# Patient Record
Sex: Male | Born: 1955 | Hispanic: No | Marital: Married
Health system: Northeastern US, Academic
[De-identification: ages and names within clinical notes are randomized; demographics above are authoritative.]

---

## 2019-09-23 ENCOUNTER — Encounter: Admit: 2019-09-23 | Payer: PRIVATE HEALTH INSURANCE

## 2019-10-18 ENCOUNTER — Ambulatory Visit: Admit: 2019-10-18 | Payer: PRIVATE HEALTH INSURANCE | Attending: Urology

## 2019-10-18 ENCOUNTER — Encounter: Admit: 2019-10-18 | Payer: PRIVATE HEALTH INSURANCE | Attending: Urology

## 2019-10-18 DIAGNOSIS — H18833 Recurrent erosion of cornea, bilateral: Secondary | ICD-10-CM

## 2019-10-18 DIAGNOSIS — S0500XA Injury of conjunctiva and corneal abrasion without foreign body, unspecified eye, initial encounter: Secondary | ICD-10-CM

## 2019-10-18 DIAGNOSIS — H04123 Dry eye syndrome of bilateral lacrimal glands: Secondary | ICD-10-CM

## 2019-10-18 DIAGNOSIS — Z9889 Other specified postprocedural states: Secondary | ICD-10-CM

## 2019-10-18 DIAGNOSIS — N399 Disorder of urinary system, unspecified: Secondary | ICD-10-CM

## 2019-10-18 DIAGNOSIS — B182 Chronic viral hepatitis C: Secondary | ICD-10-CM

## 2019-10-18 DIAGNOSIS — N401 Enlarged prostate with lower urinary tract symptoms: Secondary | ICD-10-CM

## 2019-10-18 DIAGNOSIS — R748 Abnormal levels of other serum enzymes: Secondary | ICD-10-CM

## 2019-10-18 MED ORDER — FINASTERIDE 5 MG TABLET
5 mg | ORAL_TABLET | Freq: Every day | ORAL | 7 refills | Status: AC
Start: 2019-10-18 — End: ?

## 2019-10-18 NOTE — Progress Notes
Follow-up Note Brent Joseph is a 64 y.o. male is here for follow up with established diagnosis nocturia Pt last evaluated on 09/21/19 for initial urological consultation and referral by Brent Joseph is retiring and wants to get some sleepHad trial of flomax in 2018 with little benefit acturally felt it made things worseMain source of hydration is water 7 bottles per day.  hot tea 3 x per day and iced tea 2-3 bottles at night.  Has cut down on bladder Goes to bed at 7:30 pm and up to void between 1 am and 2 am  Followed by hourly voids.There are times unable to leave house  keeps urine cups in car in case he is unable to make itReports positive bathroom mapping.  [place] PSA 0.9Pt started trial of Uroxatral on 09/21/19.  Pt feels that uroxatral is working better than flomax.  Pt states he is able to sleep 5 hoursBefore he had to void.   3/17/21Renal [place]:  No stones no hydro.  Elevated post void residual volume to 130cc in the setting of prostamegaly with circumferential urinary bladder wall thickening.  Consider UA to exclude infection as potential cause of urinary bladder wall thickeningHPIRalph  reports that he has never smoked. He has never used smokeless tobacco. He reports that he does not drink alcohol or use drugs.  Past Medical History: Diagnosis Date ? 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 Corneal abrasion   recurrent ? Dry eyes  ? Elevated liver enzymes  ? Recurrent erosion of both corneas  ? S/P inguinal hernia repair  Past Surgical History: Procedure Laterality Date ? ABDOMINAL SURGERY   ? COLONOSCOPY   ? FRACTURE SURGERY   ? INGUINAL HERNIA REPAIR   ? SHOULDER SURGERY   ? SHOULDER SURGERY    for repet. dislocation ? WRIST FRACTURE SURGERY   No Known AllergiesReview Of SystemsReview of SystemsReview of SystemsGeneral: Patient denies fevers, chills, sweats, loss of appetite, fatigue, malaise. Eyes: Patient denies blurring, double vision, vision loss, eye pain, light sensitivity. Ears/Nose/Throat: Patient denies earache, ringing in ears, decreased hearing, nasal congestion, difficulty swallowing. Cardiovascular: Patient denies chest pains, palpitations, fainting spells, shortness of breath, ankle swelling.  Respiratory: Patient denies cough, wheezing.  Skin: Patient denies rash, itching, suspicious lesions. GI: Patient denies nausea, vomiting, diarrhea, constipation, change in bowel habits, abdominal pain, bloody or black stools.  GU: See HPI  Musculoskeletal: Patient denies back pain, joint pain, joint swelling, muscle cramps, muscle weakness, stiffness, arthritis. Neurological: Patient denies transient paralysis, weakness, numbness, tingling sensation, seizures, tremors, headaches.  Psychiatric: Patient denies depression, anxiety, memory loss, suicidal thoughts, hallucinations, paranoia. Endocrine: Patient denies cold intolerance, heat intolerance, increased thirst, increased appetite, large quantities of urine. Heme/Lymphatic: Patient denies abnormal bruising, bleeding, enlarged lymph nodes. Allergy/Immunologic: Patient denies persistent infections, HIV exposures. Physical ExamThere were no vitals taken for this visit.No results found for: UCOLOR, UGLUCOSE, UKETONE, USPECGRAVITY, UPH, UPROTEIN, UNITRATES, UBLOOD, ULEUKOCYTES   No results found for: PVRPOCTPhysical ExamPhysical Exam Constitutional: oriented to person, place, and time.  appears well-developed and well-nourished. HENT: Head: Normocephalic. Eyes: No scleral icterus. Cardiovascular: Normal rate.  Pulmonary/Chest: Effort normal. Abdominal: Soft. GN:FAOZHYQ contents normal, DRE: smooth symmetrical prostate, no nodules appreciatedMusculoskeletal: Normal range of motion. Lymphadenopathy:    no cervical adenopathy. Neurological: alert and oriented to person, place, and time. Skin: Skin is warm and dry. Psychiatric: Has a normal mood and affect.  His behavior is normal. Judgment and thought content normal. Assessment & Plan:Brent Joseph is a 64 y.o. male Encounter Diagnoses Name SNOMED Brent Joseph(R) Primary? ? Benign prostatic hyperplasia with nocturia NOCTURIA DUE TO BENIGN PROSTATIC HYPERTROPHY Yes Stop all liquids 2 hours prior to bedAvoid bladder irritantsContinue uroxatralAdd proscarCysto to further evaluate bladder wall thickening noted on Brent Ravel, APRN

## 2019-10-19 DIAGNOSIS — R351 Nocturia: Secondary | ICD-10-CM

## 2019-11-23 ENCOUNTER — Ambulatory Visit: Admit: 2019-11-23 | Payer: PRIVATE HEALTH INSURANCE | Attending: Urology

## 2019-11-23 ENCOUNTER — Encounter: Admit: 2019-11-23 | Payer: PRIVATE HEALTH INSURANCE | Attending: Urology

## 2019-11-23 DIAGNOSIS — H04123 Dry eye syndrome of bilateral lacrimal glands: Secondary | ICD-10-CM

## 2019-11-23 DIAGNOSIS — N400 Enlarged prostate without lower urinary tract symptoms: Secondary | ICD-10-CM

## 2019-11-23 DIAGNOSIS — S0500XA Injury of conjunctiva and corneal abrasion without foreign body, unspecified eye, initial encounter: Secondary | ICD-10-CM

## 2019-11-23 DIAGNOSIS — B182 Chronic viral hepatitis C: Secondary | ICD-10-CM

## 2019-11-23 DIAGNOSIS — H18833 Recurrent erosion of cornea, bilateral: Secondary | ICD-10-CM

## 2019-11-23 DIAGNOSIS — R748 Abnormal levels of other serum enzymes: Secondary | ICD-10-CM

## 2019-11-23 DIAGNOSIS — Z9889 Other specified postprocedural states: Secondary | ICD-10-CM

## 2019-11-23 MED ORDER — LIDOCAINE 2 % MUCOSAL JELLY IN APPLICATOR
2 % | Freq: Once | URETHRAL | Status: CP
Start: 2019-11-23 — End: ?
  Administered 2019-11-23: 14:00:00 2 mL via URETHRAL

## 2019-11-23 NOTE — Progress Notes
Time Out Documentation?	Procedure Name: cystoscopy?	Procedure Time: 10:21 AM ?	Team Members Present: Marena Chancy, RN, Pamella Pert, MD?	Time Out initiated after patient positioned & prior to beginning of procedure: Yes?	Name of Patient  & MRUN # or DOB stated and matches ID band or previously confirmed med record number: Yes?	Proceduralist states or confirms procedure to be performed: Yes?	Procedural consent is used to verify procedure performed  & matches pt identifiers:Yes?	Site of procedure(s) (with laterality or level) is topically marked per policy & visible after draping:N/A ?	Final check completed on all specimens: not applicable?	Serial number on device: LOT [id number]

## 2019-11-23 NOTE — Progress Notes
Brent Joseph is a 64 y.o. male who presents with a chief complaint of BPHThe patient has BPH with significant lower urinary tract symptoms.  He had complained of significant nocturia (4-10x) as well as urinary urgency and frequency.  He did not have benefit from tamsulosin which he tried in 2018. Most recently he has been on alfuzosin 10 milligrams nightly since March 2021, noted some improvement in urinary symptoms. Nocturia down to 2x.Started on finasteride 5 milligrams in April 2021.He is married, rarely sexually active.Renal ultrasound on 09/22/2019 described enlarged prostate (35 cc) with circumferential urinary bladder wall thickening, PVR 130 cubic centimeters.Presents for further discussion of management of urinary symptoms.3/17/21STUDY: US RENAL?INDICATION: urinary frequency?COMPARISON: 11/28/2015?TECHNIQUE:  Grey scale and color Doppler images were obtained for interpretation.?FINDINGS:  The right kidney is 11.8 cm in length and demonstrates normal echotexture. There is no focal renal mass, nephrolithiasis or hydronephrosis. There is a mid to lower pole cyst measuring 5.9 x 5.7 x 5.8 cm, previously measuring 4.9 x 5.1 x 4.5 cm.?The left kidney is 11.8 cm in length and demonstrates normal echotexture. There is no focal renal mass, nephrolithiasis, or hydronephrosis. ?The urinary bladder is moderately distended. No focal lesion is identified. There is circumferential urinary bladder wall thickening, more so posteriorly. Bilateral ureteral jets are seen. There is prostatomegaly with the prostate volume measuring 36 cc. Echogenic foci within the prostate gland likely reflect calcifications. The prevoid volume is 320 cc. The post void residual volume is 130 cc.?   IMPRESSION: No hydronephrosis or nephrolithiasis. ?Elevated post void residual volume (130 cc) in the setting of prostatomegaly with circumferential urinary bladder wall thickening. Consider urinary analysis to exclude infection as potential cause of the urinary bladder wall thickening.AUA SYMPTOM SCORE BPHINCOMPLETE EMPTYING: Over the last month, how often have you had a sensation of not emptying your bladder completely after you finished urinating? : More than half the timeFREQUENCY: Over the last month, how often have you had to urinate again less than 2 hours after you finished urinating? : Almost alwaysINTERMITTENCY: Over the last month, how often have you stopped and started again several times when you urinated?: More than half the timeURGENCY: During the last month, how often have you found it difficult to postpone urination? : Almost alwaysWEAK STREAM: During the last month, how often have you had a weak urinary stream? : Less than half the timeSTRAINING: During the last month, how often have you had to push or strain to begin urination? : Not at allNOCTURIA: During the last month, how many times did you most typically get up to urinate from the time you went to bed at night until the time you got up in the morning? : 2 times a nightTotal Score: 22If you were to spend the rest of your life with your urinary condition just the way it is now, how would you feel about that? : MixedTotal Score (with QOL question): 25Recent PSA Results:PSA RESULTS Latest Ref Rng & Units 09/09/2019 04/27/2016 01/31/2012 01/31/2012 PSA < OR = 4.0 ng/mL 0.9 0.8 0.8 0.8 Past Medical HistoryPast Medical History: Diagnosis Date ? 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 Corneal abrasion   recurrent ? Dry eyes  ? Elevated liver enzymes  ? Recurrent erosion of both corneas  ? S/P inguinal hernia repair  Past Surgical HistoryPast Surgical History: Procedure Laterality Date ? ABDOMINAL SURGERY   ? COLONOSCOPY   ? FRACTURE SURGERY   ?  INGUINAL HERNIA REPAIR   ? SHOULDER SURGERY   ? SHOULDER SURGERY    for repet. dislocation ? WRIST FRACTURE SURGERY   AllergiesNo Known AllergiesMedicationsOutpatient Encounter Medications as of 11/23/2019 Medication Sig Dispense Refill ? alfuzosin (UROXATRAL) 10 mg 24 hr extended release tablet Take 1 tablet (10 mg total) by mouth daily. 30 tablet 11 ? finasteride (PROSCAR) 5 mg tablet Take 1 tablet (5 mg total) by mouth daily. 30 tablet 6 ? LANOLIN/MINERAL OIL/PETROLATUM (ARTIFICIAL TEARS OPHT) Place 1 drop into both eyes 5 (five) times daily..    ? glecaprevir-pibrentasvir (MAVYRET) 100-40 mg per tablet Take 3 tablets by mouth daily..   Facility-Administered Encounter Medications as of 11/23/2019 Medication Dose Route Frequency Provider Last Rate Last Admin ? [COMPLETED] lidocaine uro-jet (XYLOCAINE) 2 % jelly 11 mL  11 mL INTRA-URETHRAL Once Pamella Pert, MD   11 mL at 11/23/19 1017  Social HistorySocial History Tobacco Use ? Smoking status: Never Smoker ? Smokeless tobacco: Never Used Substance Use Topics ? Alcohol use: No ? Drug use: No   Comment: never used drugs; had 7 tattoos since 1976; no accidental needlestick exposures; no risky sexual contacts; no body piercings  Family History Family History Problem Relation Age of Onset ? Liver disease Sister       HCV ? Congenital heart disease Sister  ? Stroke Father 78 ? Coronary Artery Disease Father 73 ? Breast cancer Father 26 ? No Known Problems Mother  ? No Known Problems Brother  ? No Known Problems Brother  ? No Known Problems Son  ? Liver cancer Neg Hx  Review of SystemsConstitutional: negative for weight changeCardiovascular: negative for chest painRespiratory: negative for cough or SOBAll other systems reviewed and are negative Physical ExamBP (!) 149/87 (Site: l a, Position: Sitting, Cuff Size: Large)  - Pulse 78  - Temp 97.7 ?F (36.5 ?C)  - Ht 5' 10 (1.778 m)  - Wt 111.1 kg  - SpO2 96%  - BMI 35.15 kg/m? Constitutional:  Well developed, no acute distress.  EENT: Sclera normal, normal nares and mucosaRespiratory:  Normal respiratory effortCardiovascular: RRR,  No evidence of extremity swelling.Abdomen: No masses are palpated.  No tenderness. Inguinal: No abnormalities in either inguinal area.  Skin: warm and dryMusculoskeletal: Gait appears normal.  Neurological: well oriented to time, place, and person.   Kidney: No CVA tenderness Bladder: The bladder appears to be empty.  No masses are palpated.  There is no tenderness.  Lymphatics: There are no abnormal nodes palpated in either inguinal area.Penis: normal and is circumcised. No external lesions or masses are present. Urethral Meatus: The meatus is normal in size and location.Scrotum:  No erythema or edema of scrotal skin.   Testicles: Right testicle normal to palpation. Left testicle normal to palpation.Epididymides: Right epididymis and vas deferens are normal to palpation. Left epididymis and vas deferens normal to palpation.  No masses or tenderness noted.   Anus/Perineum: No anal or perineal masses noted.   Sphincter: The sphincter has good tone.   No rectal mass is noted.   No hemorrhoids are notedProstate:  Approximately 30 cubic centimeters, normal consistency, nontender, symmetric  Lab Results Component Value Date  UCOLOR yellow 11/23/2019  UGLUCOSE Negative 11/23/2019  UKETONE Negative 11/23/2019  USPECGRAVITY 1.015 11/23/2019  UPROTEIN Negative 11/23/2019  UNITRATES Negative 11/23/2019  UBLOOD Trace 11/23/2019  ULEUKOCYTES Negative 11/23/2019    Post Void Bladder Scan Measurement Date Value Ref Range Status 11/23/2019 2 mL Final PROCEDURES IN-OFFICE: Cystoscopy:??SURGEON: Alphia Kava MD ??ANESTHESIA:Under sterile conditions, Xylocaine  jelly was inserted into the urethra for local anesthesia. ??FINDINGS AND PROCEDURE:The history, physical findings, current medications, and indications were reviewed prior to the procedure. I discussed the procedure with the patient, including possible complications. Informed consent was obtained and a time-out performed??Patient was prepped and draped in the usual fashion in the supine position. The cystoscopy was performed using the flexible cystoscope and sterile water. ?Urethra: No abnormalities of the urethra are noted. Prostate:  Elevated median bar creating small bladder neck, mild obstruction of lateral lobes  Bladder: Bladder shows mild trabeculations. No foreign bodies. No evidence of tumor or stones.  Increased bladder expansion. Trigone unremarkable. Ureteral orifices in the normal orthotopic positionAssessment:Meliton Testa is a 64 y.o. male with BPH and associated lower urinary tract symptoms, incomplete bladder emptyingPlan:DiscussionBPH:I have discussed BPH with patient, along with the various treatments.  Discussed role of watchful waiting and lifestyle modifications (decrease caffeine, PM fluids, alcohol) to help with the symptoms of BPH. Discussed medical management with alpha blockers and/or 5-alpha reductase inhibitors including side effects of these medications. Also discussed surgical management of BPH with TURP/TUIP, UroLift, laser treatment (ablation, HOLEP), prostate artery embolization,  and open surgery. I explained the prognosis and possible complications of each surgery. The patient expresses an understanding of the treatment, possible reactions, and possible prognosis.Cystoscopy revealed obstructive bladder neck due to elevated median bar prostateContinue alfuzosin 10 milligrams nightlyStop finasteridePatient desires prostate reductive surgeryWill schedule a HOLEP, will attempt selective enucleation of the middle lobe to try to preserve antegrade ejaculationHOLEP (Holmium laser enucleation of the prostate)I explained a HOLEP is a surgery performed with special equipment through the urethra to remove  obstructing prostate tissue that is causing urinary symptoms.  Usually a Foley catheter will be placed and continuous bladder irrigation is initiated following surgery, and the patient will be monitored in the hospital for 1 to 2 days. It is common to have irritative voiding symptoms such as urinary urgency/frequency/nocturia for several weeks to months following a HOLEP; it is also common to see blood in the urine during this time period. There is a risk of retrograde ejaculation with a HOLEP. Additional risks are a urethral stricture and bladder neck contracture which would possibly require further treatment. The risk of erectile dysfunction is very low following a HOLEP. Some patients will describe post void dribbling and is it fairly common to have transient stress urinary incontinence which usually resolves over several months.  The patient understands the risk and benefits of a HOLEP and would like to proceed with surgery.Orders Placed This Encounter Procedures ? POC urinalysis manual w/o scope ? POCT Post Void Residual No follow-ups on file.Patient will let us know if any new problems or symptoms arise in the interim.Pamella Pert, MDYale [phone]

## 2019-11-23 NOTE — Progress Notes
AUA SYMPTOM SCORE BPHINCOMPLETE EMPTYING: Over the last month, how often have you had a sensation of not emptying your bladder completely after you finished urinating? : More than half the timeFREQUENCY: Over the last month, how often have you had to urinate again less than 2 hours after you finished urinating? : Almost alwaysINTERMITTENCY: Over the last month, how often have you stopped and started again several times when you urinated?: More than half the timeURGENCY: During the last month, how often have you found it difficult to postpone urination? : Almost alwaysWEAK STREAM: During the last month, how often have you had a weak urinary stream? : Less than half the timeSTRAINING: During the last month, how often have you had to push or strain to begin urination? : Not at allNOCTURIA: During the last month, how many times did you most typically get up to urinate from the time you went to bed at night until the time you got up in the morning? : 2 times a nightTotal Score: 22If you were to spend the rest of your life with your urinary condition just the way it is now, how would you feel about that? : MixedTotal Score (with QOL question): 25

## 2019-11-23 NOTE — Progress Notes
Brent Joseph is here today for a cystoscopy.  Patient informed of procedure and questions answered.  Instructions for pre procedure given to patient.  Written consent and time out obtained.  Patient prepped according to protocol.  Tolerated procedure well.  VSS.  Post procedure instructions reviewed with patient.Pre-op HOLEP instructions reviewed in detail. Kegel exercises reviewed. Verbal and written information provided. UC sent today in clinic. Pre-op blood work orders placed to [facility] per pt request. He is aware he will receive a call within 5 days from surgical scheduler.

## 2019-11-23 NOTE — Patient Instructions
Cystoscopy, Care AfterRefer to this sheet in the next few weeks. These instructions provide you with information about caring for yourself after your procedure. Your health care provider may also give you more specific instructions. Your treatment has been planned according to current medical practices, but problems sometimes occur. Call your health care provider if you have any problems or questions after your procedure.What can I expect after the procedure?After the procedure, it is common to have:?	Mild pain when you urinate. Pain should stop within a few minutes after you urinate. This may last for up to 1 week.?	A small amount of blood in your urine for several days.?	Feeling like you need to urinate but producing only a small amount of urine.Follow these instructions at home:Medicines?	Take over-the-counter and prescription medicines only as told by your health care provider.?	If you were prescribed an antibiotic medicine, take it as told by your health care provider. Do not stop taking the antibiotic even if you start to feel better.General instructions?	Return to your normal activities as told by your health care provider. Ask your health care provider what activities are safe for you.?	Do not drive for 24 hours if you received a sedative.?	Watch for any blood in your urine. If the amount of blood in your urine increases, call your health care provider.?	Follow instructions from your health care provider about eating or drinking restrictions.?	If a tissue sample was removed for testing (biopsy) during your procedure, it is your responsibility to get your test results. Ask your health care provider or the department performing the test when your results will be ready.?	Drink enough fluid to keep your urine clear or pale yellow.?	Keep all follow-up visits as told by your health care provider. This is important.Contact a health care provider if:?	You have pain that gets worse or does not get better with medicine, especially pain when you urinate.?	You have difficulty urinating.Get help right away if:?	You have more blood in your urine.?	You have blood clots in your urine.?	You have abdominal pain.?	You have a fever or chills.?	You are unable to urinate.This information is not intended to replace advice given to you by your health care provider. Make sure you discuss any questions you have with your health care provider.Document Released: 01/11/2005 Document Revised: 11/30/2015 Document Reviewed: 11/03/2016lsevier Interactive Patient Education ? 2019 Elsevier Inc.You have been referred by your provider for HoLEP procedure for BPH. Below is an overview of the procedure and what to expect before, during and after surgery. Please keep in mind not all patients follow the same course based on specific anatomy and medical and surgical history. Specific questions should be addressed with your provider and nurse. HoLEPThe Holmium Laser Enucleation of the Prostate (HoLEP) is a laser surgery used to treat benign prostatic hyperplasia. With BPH, the prostate becomes enlarged and can cause obstruction of urine flow. This can lead to many symptoms such as urinary frequency, excessive urination overnight, difficulty starting urination, or even inability to urinate. With the HoLEP, a laser is used to cut and remove the prostate tissue that is causing this blockage. After healing from this surgery, most patients experience a decrease in urinary symptoms and the ability to urinate with ease.Before surgery:-You will need a pre-operative consultation which will include a discussion of medical history along with a physical examination. BPH symptoms will be assessed. A bladder scan will be measured to see how much urine is left in the bladder after urination.-You may need a cystoscopy, which is done in the office. This will allow the urethra, prostate, and bladder to be visualized to determine if  HoLEP is the best option.-If you decide to proceed with the HoLEP, one of our Surgical Schedulers will schedule the surgery and communicate with you about any blood work or tests that will be needed prior to surgery. -The use of any blood thinners must be discontinued before surgery. You will need to contact your PCP or cardiologist that prescribes the medication to let them know you are scheduled for this procedure and be sure they approve of stopping the medication. If you are UNABLE to stop blood thinners you MUST contact our office to discuss with the provider/nurse. Aspirin and NSAID usage needs to be discontinued 10 days before surgery. Again, if you are UNABLE to stop these medications you MUST contact our office. -The day before surgery, the patient is contacted between 3pm-5pm by an automated phone call regarding the time to report to the hospital and the pre-operative instructions related to Operating Room and Hospital policies. During surgery:-This surgery is typically done under general anesthesia and can take anywhere from 1-4 hours, depending on the size of the prostate.-All instruments for this surgery are inserted through the urethra, which means there are no incisions on the body.-A special scope is inserted in the penis, then a laser is inserted and used to enucleate (core out) the obstructive prostate tissue from it's capsule (outer shell), often compared to peeling an orange from the inside. The excised prostate tissue is then removed. At the end of the surgery, a catheter is placed. -The prostate tissue is then sent to the Pathology lab to check for any signs of cancer.After Surgery: - Patients are typically sent home the same day as surgery, although some patients may require a 1 night stay in the hospital.-Patients are usually discharged with a urinary catheter in place, which will be removed in the office 2 days after surgery. This will be scheduled as a Nurse Visit for a voiding trial.-For some patients, the catheter is removed before discharge from the hospital. You will then follow up with our office 2 days after surgery to have a bladder scan completed.-Blood in the urine is expected after this surgery, often at the beginning and end of your urinary stream.-Once the catheter is removed, it is common to experience urinary leakage, especially with activity or position changes. Leakage usually improves within 6-12 weeks. The chance of long term leakage after this surgery is very low. It is common to experience urinary frequency and urgency for several weeks after this surgery.-Kegel exercises (pelvic floor exercises) should be done post operatively to help stop leakage. This exercise should not be done while urinating.-No strenuous activity, heavy lifting, or sexual activity for 3 weeks. Time off from work depends on the nature of your job. Please discuss this with your provider.-Follow up appointments after surgery are typically scheduled around 4 weeks and 4 months post operatively.If you have any questions or concerns, please feel free to contact our office at [phone] or send a message via MyChart.Kegel ExercisesKegel exercises can help strengthen your pelvic floor muscles. The pelvic floor is a group of muscles that support your rectum, small intestine, and bladder. In females, pelvic floor muscles also help support the womb (uterus). These muscles help you control the flow of urine and stool.Kegel exercises are painless and simple, and they do not require any equipment. Your provider may suggest Kegel exercises to:?	Improve bladder and bowel control.?	Improve sexual response.?	Improve weak pelvic floor muscles after surgery to remove the uterus (hysterectomy) or pregnancy (females).?	Improve weak pelvic floor muscles after prostate gland  removal or surgery (males).Kegel exercises involve squeezing your pelvic floor muscles, which are the same muscles you squeeze when you try to stop the flow of urine or keep from passing gas. The exercises can be done while sitting, standing, or lying down, but it is best to vary your position.ExercisesHow to do Kegel exercises:1.	Squeeze your pelvic floor muscles tight. You should feel a tight lift in your rectal area. If you are a male, you should also feel a tightness in your vaginal area. Keep your stomach, buttocks, and legs relaxed.2.	Hold the muscles tight for up to 10 seconds.3.	Breathe normally.4.	Relax your muscles.5.	Repeat as told by your health care provider.Repeat this exercise daily as told by your health care provider. Continue to do this exercise for at least 4-6 weeks, or for as long as told by your health care provider.You may be referred to a physical therapist who can help you learn more about how to do Kegel exercises.Depending on your condition, your health care provider may recommend:?	Varying how long you squeeze your muscles.?	Doing several sets of exercises every day.?	Doing exercises for several weeks.?	Making Kegel exercises a part of your regular exercise routine.This information is not intended to replace advice given to you by your health care provider. Make sure you discuss any questions you have with your health care provider.Document Released: 06/10/2012 Document Revised: 02/11/2018 Document Reviewed: 08/07/2019Elsevier Patient Education ? 2020 Elsevier Inc.[facility] Pre-Op InstructionsName of Surgery: HOLEPSurgery Date: TBDSurgical Scheduler: DeeA surgical scheduler will contact you within 5 business days to schedule your surgery. In some cases, they may hold a scheduling date for you that you will see in MyChart; rest assured if that date does not work with your schedule, the surgical scheduler will work with you to arrange a time that is convenient and appropriate for your care. Office Phone: [phone] : [phone]: [phone] THE DAY BEFORE SURGERY, YOU WILL BE NOTIFIED BY AN AUTOMATED SERVICE BETWEEN 3:00 - 5:00 PM REGARDING THE TIME TO REPORT TO THE HOSPITAL AND PRE-OPERATIVE INSTRUCTIONS.?	Do not eat anything after midnight before surgery including mints or candy. Clear liquids are permitted from midnight until 2 hours prior to the scheduled arrival time, unless otherwise instructed by your physician.?	NO liquids should be consumed after midnight if there is a history or symptoms of gastric reflux (GERD), nausea, vomiting, difficulty swallowing, abdominal pain, or cancer of the stomach or esophagus.?	Allowable clear liquids include the following: water, sports drinks (i.e. Gatorade, Powerade), apple juice, black coffee or tea without cream/milk, carbonated beverages and sodas. Note: Limit caffeinated beverages to less than 16 ounces.?	Please drink one 12 oz cup of apple juice or Gatorade 2 hours prior to your arrival to the hospital.?	If you have been instructed to take medications by the anesthesia staff, take with a small sip of water.?	Do not take aspirin, aspirin products (check labels), or anti-inflammatory drugs 1 week prior to surgery. No Ibuprofen, Advil, Motrin, Aleve or Naproxen?	If you need any type of pain medication, Tylenol based products are fine.?	Stop all multivitamins and herbal supplements 1 week prior to surgery.?	Please note, that you must also have someone to drive you home upon discharge if you are having One Day Surgery.?	NOTE: IF YOU ARE ON COUMADIN, WARFARIN, PLAVIX, OR ANY BLOOD THINNERS, PLEASE CONTACT YOUR PRESCRIBING PHYSICIAN TO DETERMINE WHEN YOU SHOULD STOP THE MEDICATION PRIOR TO SURGERY.		?	You will need COVID testing 24-72 hours prior to surgery. You will be contacted to schedule a few days before surgery by the COVID testing team.  ?	Please do not schedule/receive the COVID vaccine  48 hours before or after surgery.Additional Notes: ?	Please inform us immediately if you have any changes to your insurance. You can call the registration department at [phone] to make the changes. ?	All patient billing related inquires for any [facility] [place], [place], [place], [place], and [place]) should be directed to the [employer] at [phone]. This is our hospital billing call center and this team responds to inquiries for all [facility]. Our hours of operation are Monday - Friday 730am - 5pm.

## 2019-11-24 ENCOUNTER — Encounter: Admit: 2019-11-24 | Payer: PRIVATE HEALTH INSURANCE | Attending: Urology

## 2019-11-24 DIAGNOSIS — B182 Chronic viral hepatitis C: Secondary | ICD-10-CM

## 2019-11-24 DIAGNOSIS — R338 Other retention of urine: Secondary | ICD-10-CM

## 2019-11-24 DIAGNOSIS — N393 Stress incontinence (female) (male): Secondary | ICD-10-CM

## 2019-11-24 DIAGNOSIS — Z803 Family history of malignant neoplasm of breast: Secondary | ICD-10-CM

## 2019-11-24 DIAGNOSIS — Z8249 Family history of ischemic heart disease and other diseases of the circulatory system: Secondary | ICD-10-CM

## 2019-11-24 DIAGNOSIS — N401 Enlarged prostate with lower urinary tract symptoms: Secondary | ICD-10-CM

## 2019-11-24 DIAGNOSIS — N3943 Post-void dribbling: Secondary | ICD-10-CM

## 2019-11-24 LAB — URINE CULTURE: BKR URINE CULTURE, ROUTINE: NO GROWTH

## 2019-11-29 ENCOUNTER — Telehealth: Admit: 2019-11-29 | Payer: PRIVATE HEALTH INSURANCE | Attending: Urology

## 2019-11-29 NOTE — Telephone Encounter
LM on VM TCB Re: Surgery date and Clearance

## 2019-11-30 ENCOUNTER — Encounter: Admit: 2019-11-30 | Payer: PRIVATE HEALTH INSURANCE

## 2019-11-30 ENCOUNTER — Telehealth: Admit: 2019-11-30 | Payer: PRIVATE HEALTH INSURANCE | Attending: Urology

## 2019-11-30 DIAGNOSIS — Z01818 Encounter for other preprocedural examination: Secondary | ICD-10-CM

## 2019-11-30 NOTE — Telephone Encounter
Spoke w/Mase Smith Robert Confirmed 7/2 for HOLEP Will need brief Clearance w/Dr. Liliane Bade - Req faxed and scanned Preop instructions via My Chart Y[facility] - 1 month FU w/Jessic Dorthey Sawyer, NP Dr. Oliva Bustard - He wishes to stay overnight to have foley removed next day due to Monday office closed for 4th of JulyNursing - Place order for urine culture at his lab of preference within 2 weeks of surgery date and call to review preop instructions.

## 2019-11-30 NOTE — Telephone Encounter
Called Brent Joseph and updated him on orders placed and preop instructions.

## 2019-12-21 LAB — COMPREHENSIVE METABOLIC PANEL
ALBUMIN/GLOBULIN RATIO: 1.4 (calc) (ref 1.0–2.5)
ALBUMIN: 4.5 g/dL (ref 3.6–5.1)
ALKALINE PHOSPHATASE: 60 U/L (ref 35–144)
ALT (SGPT): 82 U/L — ABNORMAL HIGH (ref 9–46)
AST (SGOT): 54 U/L — ABNORMAL HIGH (ref 10–35)
BILIRUBIN, TOTAL: 0.6 mg/dL (ref 0.2–1.2)
BLOOD UREA NITROGEN: 15 mg/dL (ref 7–25)
CALCIUM: 9.5 mg/dL (ref 8.6–10.3)
CHLORIDE: 103 mmol/L (ref 98–110)
CO2: 26 mmol/L (ref 20–32)
CREATININE: 0.9 mg/dL (ref 0.70–1.25)
EGFR (AFR AMER): 105 mL/min/{1.73_m2} (ref >=60)
EGFR (NON AFRICAN AMERICAN): 91 mL/min/{1.73_m2} (ref >=60)
GLOBULIN: 3.3 g/dL (ref 1.9–3.7)
GLUCOSE: 87 mg/dL (ref 65–139)
POTASSIUM: 4.2 mmol/L (ref 3.5–5.3)
PROTEIN, TOTAL, SPEP: 7.8 g/dL (ref 6.1–8.1)
SODIUM: 139 mmol/L (ref 135–146)

## 2019-12-21 LAB — CBC AND DIFFERENTIAL
BASOPHILS ABSOLUTE COUNT: 32 {cells}/uL (ref 0–200)
BASOPHILS: 0.5 %
EOSINOPHILS ABSOLUTE COUNT: 101 {cells}/uL (ref 15–500)
EOSINOPHILS: 1.6 %
HEMATOCRIT BLOOD: 41.8 % (ref 38.5–50.0)
HEMOGLOBIN: 14.3 g/dL (ref 13.2–17.1)
LYMPHOCYTES ABSOLUTE COUNT: 2432 {cells}/uL (ref 850–3900)
LYMPHOCYTES: 38.6 %
MCH: 28.6 pg (ref 27.0–33.0)
MCHC-HEMOGLOBINOPATHY: 34.2 g/dL (ref 32.0–36.0)
MCV: 83.6 fL (ref 80.0–100.0)
MONOCYTES ABSOLUTE COUNT: 529 {cells}/uL (ref 200–950)
MONOCYTES: 8.4 %
MPV: 12 fL (ref 7.5–12.5)
NEUTROPHILS ABSOLUTE COUNT: 3207 {cells}/uL (ref 1500–7800)
NEUTROPHILS: 50.9 %
PLATELET COUNT: 167 10*3/uL (ref 140–400)
RDW: 13 % (ref 11.0–15.0)
RED BLOOD CELL COUNT: 5 10*6/uL (ref 4.20–5.80)
WHITE BLOOD CELL COUNT: 6.3 10*3/uL (ref 3.8–10.8)

## 2019-12-24 ENCOUNTER — Encounter: Admit: 2019-12-24 | Payer: PRIVATE HEALTH INSURANCE | Attending: Adult Health

## 2019-12-24 ENCOUNTER — Ambulatory Visit: Admit: 2019-12-24 | Payer: PRIVATE HEALTH INSURANCE | Attending: Gerontology

## 2019-12-24 DIAGNOSIS — Z01818 Encounter for other preprocedural examination: Secondary | ICD-10-CM

## 2019-12-30 ENCOUNTER — Encounter: Admit: 2019-12-30 | Payer: PRIVATE HEALTH INSURANCE | Attending: Urology

## 2019-12-30 DIAGNOSIS — S0500XA Injury of conjunctiva and corneal abrasion without foreign body, unspecified eye, initial encounter: Secondary | ICD-10-CM

## 2019-12-30 DIAGNOSIS — H04123 Dry eye syndrome of bilateral lacrimal glands: Secondary | ICD-10-CM

## 2019-12-30 DIAGNOSIS — R748 Abnormal levels of other serum enzymes: Secondary | ICD-10-CM

## 2019-12-30 DIAGNOSIS — H18833 Recurrent erosion of cornea, bilateral: Secondary | ICD-10-CM

## 2019-12-30 DIAGNOSIS — Z9889 Other specified postprocedural states: Secondary | ICD-10-CM

## 2019-12-30 DIAGNOSIS — B182 Chronic viral hepatitis C: Secondary | ICD-10-CM

## 2020-01-02 LAB — URINALYSIS-MACROSCOPIC W/REFLEX MICROSCOPIC
BILIRUBIN: NEGATIVE
GLUCOSE UA: NEGATIVE
KETONES UA: NEGATIVE
LEUKOCYTE ESTERASE UA: NEGATIVE
NITRITE UA: NEGATIVE
OCCULT BLOOD: NEGATIVE
PH: 6 (ref 5.0–8.0)
PROTEIN UA: NEGATIVE
SPECIFIC GRAVITY UA: 1.006 (ref 1.001–1.035)

## 2020-01-02 LAB — URINE CULTURE

## 2020-01-03 ENCOUNTER — Encounter: Admit: 2020-01-03 | Payer: PRIVATE HEALTH INSURANCE | Attending: Adult Health

## 2020-01-04 ENCOUNTER — Inpatient Hospital Stay: Admit: 2020-01-04 | Discharge: 2020-01-04 | Payer: PRIVATE HEALTH INSURANCE

## 2020-01-04 DIAGNOSIS — Z20822 Contact with and (suspected) exposure to covid-19: Secondary | ICD-10-CM

## 2020-01-04 DIAGNOSIS — Z01818 Encounter for other preprocedural examination: Secondary | ICD-10-CM

## 2020-01-04 DIAGNOSIS — Z01812 Encounter for preprocedural laboratory examination: Secondary | ICD-10-CM

## 2020-01-05 LAB — COVID-19 CLEARANCE OR FOR PLACEMENT ONLY: BKR SARS-COV-2 RNA (COVID-19) (YH): NOT DETECTED

## 2020-01-06 ENCOUNTER — Telehealth: Admit: 2020-01-06 | Payer: PRIVATE HEALTH INSURANCE | Attending: Urology

## 2020-01-06 NOTE — Telephone Encounter
Called Brent Joseph letting him know that Dr Marchia Bond office note said he will stay in the hospital 1-2 days after procedure and that he can talk to MD about catheter removal before his procedure.

## 2020-01-06 NOTE — Telephone Encounter
Brent Joseph calling, he has concerns regarding tomorrows procedure.He was told by Dr Oliva Bustard he would have the procedure tomorrow afternoon,stay overnight and have the catheter removed Saturday morning then be discharged.He states he received a call today that he will be sent home tomorrow by 2 pm.He states he needs the catheter out for work by 7/6 and Monday is a [place].He would like a call back as soon as possible to discuss.Will route to nurse pool.

## 2020-01-07 ENCOUNTER — Inpatient Hospital Stay: Admit: 2020-01-07 | Discharge: 2020-01-07 | Payer: PRIVATE HEALTH INSURANCE

## 2020-01-07 ENCOUNTER — Ambulatory Visit: Admit: 2020-01-07 | Payer: PRIVATE HEALTH INSURANCE | Attending: Gerontology

## 2020-01-07 ENCOUNTER — Encounter: Admit: 2020-01-07 | Payer: PRIVATE HEALTH INSURANCE | Attending: Urology

## 2020-01-07 DIAGNOSIS — E669 Obesity, unspecified: Secondary | ICD-10-CM

## 2020-01-07 DIAGNOSIS — Z6835 Body mass index (BMI) 35.0-35.9, adult: Secondary | ICD-10-CM

## 2020-01-07 DIAGNOSIS — R748 Abnormal levels of other serum enzymes: Secondary | ICD-10-CM

## 2020-01-07 DIAGNOSIS — Z79899 Other long term (current) drug therapy: Secondary | ICD-10-CM

## 2020-01-07 DIAGNOSIS — H18833 Recurrent erosion of cornea, bilateral: Secondary | ICD-10-CM

## 2020-01-07 DIAGNOSIS — B182 Chronic viral hepatitis C: Secondary | ICD-10-CM

## 2020-01-07 DIAGNOSIS — N401 Enlarged prostate with lower urinary tract symptoms: Secondary | ICD-10-CM

## 2020-01-07 DIAGNOSIS — N3289 Other specified disorders of bladder: Secondary | ICD-10-CM

## 2020-01-07 DIAGNOSIS — N138 Other obstructive and reflux uropathy: Secondary | ICD-10-CM

## 2020-01-07 DIAGNOSIS — Z9889 Other specified postprocedural states: Secondary | ICD-10-CM

## 2020-01-07 DIAGNOSIS — H04123 Dry eye syndrome of bilateral lacrimal glands: Secondary | ICD-10-CM

## 2020-01-07 DIAGNOSIS — S0500XA Injury of conjunctiva and corneal abrasion without foreign body, unspecified eye, initial encounter: Secondary | ICD-10-CM

## 2020-01-07 MED ORDER — FENTANYL (PF) 50 MCG/ML INJECTION SOLUTION
50 mcg/mL | Status: CP
Start: 2020-01-07 — End: ?

## 2020-01-07 MED ORDER — MIDAZOLAM (PF) 1 MG/ML INJECTION SOLUTION
1 mg/mL | Status: DC | PRN
Start: 2020-01-07 — End: 2020-01-07
  Administered 2020-01-07: 15:00:00 1 mg/mL via INTRAVENOUS

## 2020-01-07 MED ORDER — WATER FOR IRRIGATION, STERILE SOLUTION
Status: DC | PRN
Start: 2020-01-07 — End: 2020-01-07
  Administered 2020-01-07: 15:00:00

## 2020-01-07 MED ORDER — NALOXONE 0.4 MG/ML INJECTION SOLUTION
0.4 mg/mL | INTRAVENOUS | Status: DC | PRN
Start: 2020-01-07 — End: 2020-01-07

## 2020-01-07 MED ORDER — CHLORHEXIDINE GLUCONATE 0.12 % MOUTHWASH
0.12 % | Freq: Once | OROMUCOSAL | Status: DC
Start: 2020-01-07 — End: 2020-01-07

## 2020-01-07 MED ORDER — LIDOCAINE (PF) 20 MG/ML (2 %) INJECTION SOLUTION
20 mg/mL (2 %) | Status: DC | PRN
Start: 2020-01-07 — End: 2020-01-07
  Administered 2020-01-07: 15:00:00 20 mg/mL (2 %) via INTRAVENOUS

## 2020-01-07 MED ORDER — PROPOFOL 10 MG/ML INTRAVENOUS EMULSION
10 mg/mL | Status: DC | PRN
Start: 2020-01-07 — End: 2020-01-07
  Administered 2020-01-07: 15:00:00 10 mg/mL via INTRAVENOUS

## 2020-01-07 MED ORDER — MIDAZOLAM (PF) 1 MG/ML INJECTION SOLUTION
1 mg/mL | Status: CP
Start: 2020-01-07 — End: ?

## 2020-01-07 MED ORDER — SUGAMMADEX 100 MG/ML INTRAVENOUS SOLUTION
100 mg/mL | Status: DC | PRN
Start: 2020-01-07 — End: 2020-01-07
  Administered 2020-01-07: 16:00:00 100 mg/mL via INTRAVENOUS

## 2020-01-07 MED ORDER — LACTATED RINGERS INTRAVENOUS SOLUTION
Status: DC | PRN
Start: 2020-01-07 — End: 2020-01-07
  Administered 2020-01-07: 15:00:00 via INTRAVENOUS

## 2020-01-07 MED ORDER — KETOROLAC 30 MG/ML (1 ML) INJECTION SOLUTION
30 mg/mL (1 mL) | Status: DC | PRN
Start: 2020-01-07 — End: 2020-01-07
  Administered 2020-01-07: 16:00:00 30 mg/mL (1 mL) via INTRAVENOUS

## 2020-01-07 MED ORDER — SENNOSIDES 8.6 MG-DOCUSATE SODIUM 50 MG TABLET
ORAL_TABLET | Freq: Every day | ORAL | 1 refills | Status: AC
Start: 2020-01-07 — End: ?

## 2020-01-07 MED ORDER — SODIUM CHLORIDE 0.9 % (FLUSH) INJECTION SYRINGE
0.9 % | INTRAVENOUS | Status: DC | PRN
Start: 2020-01-07 — End: 2020-01-07

## 2020-01-07 MED ORDER — ONDANSETRON HCL (PF) 4 MG/2 ML INJECTION SOLUTION
42 mg/2 mL | INTRAVENOUS | Status: DC | PRN
Start: 2020-01-07 — End: 2020-01-07
  Administered 2020-01-07: 16:00:00 4 mL via INTRAVENOUS

## 2020-01-07 MED ORDER — ROCURONIUM 10 MG/ML INTRAVENOUS SOLUTION
10 mg/mL | Status: CP
Start: 2020-01-07 — End: ?

## 2020-01-07 MED ORDER — ROCURONIUM 10 MG/ML INTRAVENOUS SOLUTION
10 mg/mL | Status: DC | PRN
Start: 2020-01-07 — End: 2020-01-07
  Administered 2020-01-07: 15:00:00 10 mg/mL via INTRAVENOUS

## 2020-01-07 MED ORDER — FENTANYL (PF) 50 MCG/ML INJECTION SOLUTION
50 mcg/mL | INTRAVENOUS | Status: DC | PRN
Start: 2020-01-07 — End: 2020-01-07

## 2020-01-07 MED ORDER — HYDROMORPHONE 0.5 MG/0.5 ML INJECTION SYRINGE
0.50.5 mg/ mL | INTRAVENOUS | Status: DC | PRN
Start: 2020-01-07 — End: 2020-01-07

## 2020-01-07 MED ORDER — CEFAZOLIN 1 GRAM SOLUTION FOR INJECTION
1 gram | Status: DC | PRN
Start: 2020-01-07 — End: 2020-01-07
  Administered 2020-01-07: 15:00:00 1 gram via INTRAVENOUS

## 2020-01-07 MED ORDER — FENTANYL (PF) 50 MCG/ML INJECTION SOLUTION
50 mcg/mL | Status: DC | PRN
Start: 2020-01-07 — End: 2020-01-07
  Administered 2020-01-07: 15:00:00 50 mcg/mL via INTRAVENOUS

## 2020-01-07 MED ORDER — SODIUM CHLORIDE 0.9 % (FLUSH) INJECTION SYRINGE
0.9 % | Freq: Three times a day (TID) | INTRAVENOUS | Status: DC
Start: 2020-01-07 — End: 2020-01-07

## 2020-01-07 MED ORDER — PHENAZOPYRIDINE 200 MG TABLET
200 mg | ORAL_TABLET | Freq: Three times a day (TID) | ORAL | 1 refills | Status: AC
Start: 2020-01-07 — End: ?

## 2020-01-07 MED ORDER — PROPOFOL 10 MG/ML INTRAVENOUS EMULSION
10 mg/mL | Status: CP
Start: 2020-01-07 — End: ?

## 2020-01-07 NOTE — Discharge Instructions
Holmium Laser Enucleation of the Prostate (HoLEP) InstructionsWhat to expect:- After a HoLEP procedure, you may feel some pain or spasms when you urinate, burning with urination or the urge to urinate more often. This will get better over time. - It is normal to have some blood in your urine for the first few days and even weeks after surgery. Blood in the urine is like food coloring, so even a drop can turn the urine very red.- You may leak urine for several weeks to months following your surgery. This routinely gets better over time. Ask your doctor about pelvic floor exercises if you are still leaking at the time of your follow up appointment. You may require the use of adult incontinence pads to keep yourself dry in public.Activities: - You may return to your normal activities tomorrow, but refrain from strenuous activity and heavy lifting for the next 3 weeksMedications: - If you are having burning with urination, you may take Pyridium. It will turn your urine orange.- Take tylenol for pain. You can take up to the maximum dose indicated on the label. Do not take more than 4,000mg  of total acetaminophen per day.- Remember to drink lots of water and take the stool softener to prevent constipation or straining during bowel movements.Call your doctor for:- Being unable to keep food down - Difficulty or Inability to Urinate- Fevers/chills- Shortness of breath or chest pain that does not resolve with rest- Questions or concerns!Follow up:You will follow up with Verline Lema, APRN for a post-operative appointment. For any questions or concerns, please call the Urology Clinic at [phone] Appointments     Provider Department Center  02/07/2020 1:00 PM Kenhorst, Salena Saner, NP [facility] Urology at [address] [facility]

## 2020-01-07 NOTE — Anesthesia Pre-Procedure Evaluation
This is a 64 y.o. male scheduled for CYSTOSCOPY AND HOLMIUM LASER ENUCLEATION OF PROSTATE (N/A ).Review of Systems/ Medical HistoryPatient summary, nursing notes, EKG/Cardiac Studies , Labs, pre-procedure vitals, height, weight and NPO status reviewed.No previous anesthesia concernsAnesthesia Evaluation: Estimated body mass index is 35.73 kg/m? as calculated from the following:  Height as of this encounter: 5' 10 (1.778 m).  Weight as of this encounter: 112.9 kg. CC/HPI: Pt has severe h/o of dry eyes ,h/l corneal abrasionsGastrointestinal/Genitourinary: -Nutritional Disorders: Patient has has increased body weight- obesity.Physical ExamCardiovascular:  Rhythm: regularHeart Sounds: S1 present and S2 present.Pulmonary:  Patient's breath sounds clear to auscultationAirway:  Mallampati: ITM distance: >3 FBNeck ROM: fullDental:  normal exam  Anesthesia PlanASA 2 The primary anesthesia plan is  general ETT. Perioperative Code Status confirmed: It is my understanding that the patient is currently designated as 'Full Code' and will remain so throughout the perioperative period.Anesthesia informed consent obtained. Consent obtained from: patientUse of blood products: consented  The post operative pain plan is per surgeon management.Plan discussed with CRNA.Anesthesiologist's Pre Op NoteI personally evaluated and examined the patient prior to the intra-operative phase of care.

## 2020-01-07 NOTE — Other
Post Anesthesia Transfer of Care NotePatient: Brent RaoProcedure(s) Performed: Procedure(s) (LRB):CYSTOSCOPY AND HOLMIUM LASER ENUCLEATION OF PROSTATE (N/A) Patient location: PACU Last Vitals: Vitals Value Taken Time BP 135/102 01/07/20 1154 Temp  01/07/20 1155 Pulse 73 01/07/20 1154 Resp 14 01/07/20 1154 SpO2 99 % 01/07/20 1154 Level of consciousness: sedated and responds to stimulation follows commandsTransport Vital Signs:  Stable since the last set of recorded intra-operative vital signsComplications: noneIntra-operative Intake & Output and Antibiotics as per Anesthesia record and discussed with the RN.

## 2020-01-07 NOTE — Brief Op Note
[facility]Patient Name: [facility]        [id number] Patient DOB: Dec 06, 1955     Surgery Date: 7/2/2021Surgeon(s) and Role:   * Pamella Pert, MD - PrimaryAssistant(s):Resident: Otho Perl, MDStaff:  Circulator: Laural Benes, RN; Tobie Lords, RNRelief Circulator: Spero Geralds Vivien Rota, RNRelief Scrub: Tyler Aas Person: Marianna Fuss Student: Toya Smothers, STUDENTPre-Op Diagnosis: Benign prostatic hyperplasia, unspecified whether lower urinary tract symptoms present [N40.0] Procedure(s) and Anesthesia Type:   * CYSTOSCOPY AND HOLMIUM LASER ENUCLEATION OF PROSTATE - GENERALOperative Findings (enter relevant operative findings; do not refer to an operative report that is not yet transcribed): selective middle lobe holopSigns of infection present at the time of surgery at the operative site: None Blood and Blood Products: none                 Drains:  23fr 3 way on cbiImplants: * No implants in log * Specimens: ID Type Source Tests Collected by Time 1 : PROSTATE TISSUE Tissue Prostate PATHOLOGY [facility] LMW [facility]) Pamella Pert, MD 01/07/2020 10:58 AM  Clinical Staging: naEBL: 25 mL       Post Operative Diagnosis: * No post-op diagnosis entered * Otho Perl, MD7/2/202111:56 AM

## 2020-01-07 NOTE — Other
[facility] RE[facility]   CONFIDENTIAL - DO NOT COPY WITHOUT APPROPRIATE AUTHORIZATION Name: Brent RaoMRN: [id number] CSN: [id number] Service Area: SurgeryDate of Birth: 11/16/1955 Date of Adm: 7/2/2021Date of Procedure/Surgery: 7/2/2021Operation: Procedure:    CYSTOSCOPY AND HOLMIUM LASER ENUCLEATION OF PROSTATECPT(R) Code:  78295 - PR LASER ENUCLEATION PROSTATE W MORCELLATIONPre-Operative Diagnosis: Benign prostatic hyperplasia, unspecified whether lower urinary tract symptoms present [N40.0]Post-Operative Diagnosis: SameSurgeon: Attending: Pamella Pert, MDAssistant: Resident: Otho Perl, MDAnesthesia: ANESTHESIOLOGIST: Delray Alt, MDCRNA: Irene Pap, CRNA; Sharen Hones, CRNAType: LMAEstimated Blood Loss: MinimalIV Fluids: Per recordFINDINGS: high median bar creating tight bladder neckCOMPLICATIONS: NoneCONDITION AT END OF PROCEDURE: SatisfactoryPROCEDURE:The indications, risks and benefits, side effects (urinary retention, hematuria, UTI, urethral stricture stricture, retrograde ejaculation, very small chance of erectile dysfunction and urinary incontinence which is usually transient). Informed consent was obtained.Indications: BPH with bladder outflow obstructionThe patient taken to procedure room and placed in the dorsolithotomy position. The genital region was prepped and draped using the usual sterile technique.IV antibiotics were administered. The anterior urethra was calibrated with Sissy Hoff Sounds up to 30 F. A 26 fr Laser Resectoscope (Wolf) was used, saline irrigation was with a Thermedex irrigation unit with pressure at 60 to 80cm water.  The prostate appeared obstructed by an elevated median bar creating a tight bladder neck- the scope had to be torqued downward with significant force to get past the elevated median bar. Both ureteral orifices were normal in position with clear urine efflux bilaterally. There was no stone, tumor, diverticulum, or other pathology within the bladder. The bladder mucosa was normal with moderate trabeculation.  A 550uM holmium laser fiber was used with laser settings of 2.0 J and 35Hz  cut MOSES and 1.0 J and 20Hz  coagulation long wave length. Trilobar Enucleation:Laser enucleation proceeded with 5 and 7 o'clock incisions at the [facility]- incisions were carried down to fibers of the bladder neck and the distally to the proximal aspect of the veru. The incision was deepened to the level of the prostatic capsule posteriorly.   Median lobe enucleation then proceeded in the capsular plane after dividing the mucosa proximal to the verumontanum. The median lobe was dissected free in a retrograde manner and flipped into the bladder, with care to avoid the ureteric orifices, the last mucosal attachments were dividing liberating the median lobe into the bladder. The lateral lobes were not obstructing and were left in placed. A wide open channel had been created from the veru to the bladder neck.The laser energy was used to control any bleeding vessels in the prostatic fossae prior to placing the [facility] morcellator through a nephroscope. The Piranhna morcellator was used to morcellate and remove all tissue. Prostate fossae and bladder were again inspected and no residual tissue was seen. A 22 fr 3 way catheter was placed with 15cc in balloon, continuous irrigation with normal saline was continued and the patient was awoken from anesthesia and returned to recovery with clear urine.The patient tolerated procedure well, without significant blood loss or complication. The patient entered recovery in stable condition. I, Dr Oliva Bustard, was present for this entire procedure and performed all critical portions of the surgery. SPECIMENS: prostate chipsSDS = 2Daniel Oliva Bustard, MDCC: Dr Shelby Dubin Mejnartowicz

## 2020-01-07 NOTE — Anesthesia Post-Procedure Evaluation
Anesthesia Post-op NotePatient: Brent Sexton RaoProcedure(s):  Procedure(s) (LRB):CYSTOSCOPY AND HOLMIUM LASER ENUCLEATION OF PROSTATE (N/A) Patient location: PACULast Vitals:  I have noted the vital signs as listed in the nursing notes.Mental status recovered: patient participates in evaluation: YesVital signs reviewed: YesRespiratory function stable:YesAirway is patent: YesCardiovascular function and hydration status stable: YesPain control satisfactory: YesNausea and vomiting control satisfactory:Yes

## 2020-01-07 NOTE — Other
Brent Joseph is a 64 y.o. male who presents with a chief complaint of BPH?The patient has BPH with significant lower urinary tract symptoms.  He had complained of significant nocturia (4-10x) as well as urinary urgency and frequency.  He did not have benefit from tamsulosin which he tried in 2018. Most recently he has been on alfuzosin 10 milligrams nightly since March 2021, noted some improvement in urinary symptoms. Nocturia down to 2x.?Started on finasteride 5 milligrams in April 2021.?He is married, rarely sexually active.?Renal ultrasound on 09/22/2019 described enlarged prostate (35 cc) with circumferential urinary bladder wall thickening, PVR 130 cubic centimeters.PMH:  has a past medical history of Chronic hepatitis C without mention of hepatic coma, Corneal abrasion, Dry eyes, Elevated liver enzymes, Recurrent erosion of both corneas, and S/P inguinal hernia repair.PSH:  has a past surgical history that includes Shoulder surgery; Wrist fracture surgery; Inguinal hernia repair; Shoulder surgery; Colonoscopy; Abdominal surgery; and Fracture surgery.Allergies: Patient has no known allergies.VITALS:Temp:  [98.3 ?F (36.8 ?C)] 98.3 ?F (36.8 ?C)Pulse:  [75] 75Resp:  [18] 18BP: (135-156)/(91-111) 135/91SpO2:  [97 %] 97 %Device (Oxygen Therapy): room airEXAM:NADCTA, RRLabs:No results for input(s): WBC in the last 168 hours.Invalid input(s): HEMOGLOBIN, HEMATOCRIT, PLATELET No results for input(s): SODIUM, HCO3, CO2, BUN, CREATININE, GLU in the last 168 hours.Invalid input(s): POTASSIUM, CHLORIDE, GFRNo results for input(s): INR in the last 168 hours.Invalid input(s): APTNo results found for this or any previous visit (from the past 1008 hour(s)).Plan:Will proceed to OR for HOLEP.Electronically Signed by Pamella Pert, MD, January 07, 2020

## 2020-01-07 NOTE — Other
Operative Diagnosis:Pre-op:   Benign prostatic hyperplasia, unspecified whether lower urinary tract symptoms present [N40.0] Patient Coded Diagnosis   Pre-op diagnosis: Benign prostatic hyperplasia, unspecified whether lower urinary tract symptoms present  Post-op diagnosis: Benign prostatic hyperplasia, unspecified whether lower urinary tract symptoms present  Patient Diagnosis   Pre-op diagnosis: Benign prostatic hyperplasia, unspecified whether lower urinary tract symptoms present [N40.0]  Post-op diagnosis:     Post-op diagnosis:   * Benign prostatic hyperplasia, unspecified whether lower urinary tract symptoms present [N40.0]Operative Procedure(s) :Procedure(s) (LRB):CYSTOSCOPY AND HOLMIUM LASER ENUCLEATION OF PROSTATE (N/A)Post-op Procedure & Diagnosis ConfirmationPost-op Diagnosis: Post-op Diagnosis confirmed (no changes)Post-op Procedure: Post-op Procedure confirmed (no changes)

## 2020-02-07 ENCOUNTER — Encounter: Admit: 2020-02-07 | Payer: PRIVATE HEALTH INSURANCE | Attending: Adult Health

## 2020-02-07 ENCOUNTER — Ambulatory Visit: Admit: 2020-02-07 | Payer: PRIVATE HEALTH INSURANCE | Attending: Adult Health

## 2020-02-07 DIAGNOSIS — S0500XA Injury of conjunctiva and corneal abrasion without foreign body, unspecified eye, initial encounter: Secondary | ICD-10-CM

## 2020-02-07 DIAGNOSIS — B182 Chronic viral hepatitis C: Secondary | ICD-10-CM

## 2020-02-07 DIAGNOSIS — N400 Enlarged prostate without lower urinary tract symptoms: Secondary | ICD-10-CM

## 2020-02-07 DIAGNOSIS — R748 Abnormal levels of other serum enzymes: Secondary | ICD-10-CM

## 2020-02-07 DIAGNOSIS — H18833 Recurrent erosion of cornea, bilateral: Secondary | ICD-10-CM

## 2020-02-07 DIAGNOSIS — Z9889 Other specified postprocedural states: Secondary | ICD-10-CM

## 2020-02-07 DIAGNOSIS — H04123 Dry eye syndrome of bilateral lacrimal glands: Secondary | ICD-10-CM

## 2020-02-07 NOTE — Progress Notes
Follow-up Note Brent Joseph is a 64 y.o. male who presents for post operative follow up s/p HOLEP.[name] has a PMH significant for BPH with nocturia. He underwent HOLEP surgery with Dr. Oliva Bustard on 01/07/20.Today he reports doing well following HOLEP procedure. He endorses a strong urinary stream, urinary frequency/urgency improving. Nocturia x 3, pre op x 5 +. Irritative symptoms generally improving. No urinary leakage, dysuria, hematuria, fever, chills.He will be moving to [place] at the end of August.Pathology of prostatic chips demonstrated BPH.SDS: 2  Past Medical History: Diagnosis Date ? 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 Corneal abrasion   recurrent ? Dry eyes  ? Elevated liver enzymes  ? Recurrent erosion of both corneas  ? S/P inguinal hernia repair  Past Surgical History: Procedure Laterality Date ? ABDOMINAL SURGERY   ? COLONOSCOPY   ? FRACTURE SURGERY   ? INGUINAL HERNIA REPAIR   ? SHOULDER SURGERY   ? SHOULDER SURGERY    for repet. dislocation ? WRIST FRACTURE SURGERY   No Known AllergiesReview Of SystemsReview of Systems As per HPI.Physical ExamBP (!) 152/104 (Site: l a, Position: Sitting, Cuff Size: Large)  - Pulse 78  - Temp 97.9 ?F (36.6 ?C) (Temporal)  - Ht 5' 10 (1.778 m)  - Wt 108.9 kg  - SpO2 98%  - BMI 34.44 kg/m? Lab Results Component Value Date  UCOLOR yellow 02/07/2020  UGLUCOSE Negative 02/07/2020  UKETONE Negative 02/07/2020  USPECGRAVITY 1.015 02/07/2020  UPROTEIN 1+ 02/07/2020  UNITRATES Negative 02/07/2020  UBLOOD 2+ 02/07/2020  ULEUKOCYTES Negative 02/07/2020     Post Void Bladder Scan Measurement Date Value Ref Range Status 02/07/2020 13 mL Final Physical ExamAppearance: Normal appearing male HENT: normocephalic and atraumatic.Neck. Neck is supple Chest: Normal respiratory effort without distress. Musculoskeletal: Normal range of motion. Skin: Skin is warm without obvious lesions. Mental status: Awake and alert.Kidney: No CVA tendernessAbdomen: Soft, NT ND.Assessment & Plan:Brent Joseph is a 64 y.o. male s/p HOLEP.Mr. Brent Joseph continues to do well post operatively.  I provided anticipatory guidance for post operative course, including improvement of transient urinary leakage, hematuria, and irritative symptoms over time. He was advised to discontinue BPH medications.  He will have PSA checked in 3 mo to establish new PSA baseline.  He will follow up with Dr. Oliva Bustard in 3 months, and will reach out with any concerns in the interim.  He was encouraged to make follow up visit with Dr. Oliva Bustard prior to moving. Encounter Diagnoses Name SNOMED [place](R) Primary? ? Benign prostatic hyperplasia, unspecified whether lower urinary tract symptoms present BENIGN PROSTATIC HYPERPLASIA Yes French Ana, NP

## 2020-02-07 NOTE — Progress Notes
AUA SYMPTOM SCORE BPHINCOMPLETE EMPTYING: Over the last month, how often have you had a sensation of not emptying your bladder completely after you finished urinating? : (P) Less than 1 time in 5FREQUENCY: Over the last month, how often have you had to urinate again less than 2 hours after you finished urinating? : (P) Less than 1 time in 5INTERMITTENCY: Over the last month, how often have you stopped and started again several times when you urinated?: (P) Less than 1 time in 5URGENCY: During the last month, how often have you found it difficult to postpone urination? : (P) Less than 1 time in 5WEAK STREAM: During the last month, how often have you had a weak urinary stream? : (P) Less than 1 time in 5STRAINING: During the last month, how often have you had to push or strain to begin urination? : (P) Less than 1 time in 5NOCTURIA: During the last month, how many times did you most typically get up to urinate from the time you went to bed at night until the time you got up in the morning? : (P) 3 times a nightTotal Score: (P) 9If you were to spend the rest of your life with your urinary condition just the way it is now, how would you feel about that? : (P) MixedTotal Score (with QOL question): (P) 12

## 2023-01-20 IMAGING — MR MRI ABDOMEN WITHOUT CONTRAST
7 of 11 series · 24 of 48 positions shown · non-contrast
Comparison: Previous sonogram

________________________________________________________________________________________________ 
MRI ABDOMEN WITHOUT CONTRAST, 01/20/2023 [DATE]: 
CLINICAL INDICATION: Abnormal lab work. Fatty infiltration liver.
TECHNIQUE: Multiplanar, multiecho position MR images of the abdomen were 
performed without intravenous enhancement. Patient was scanned on a 3T magnet.

[Series 301: survey navi · axial · 15.0mm · 1.67mm/px · z∈[+10,+232]mm · 2 of 11 slices shown]
[im 1/11]
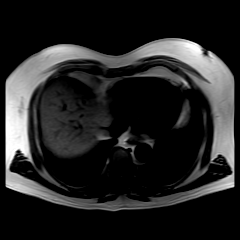
[im 11/11]
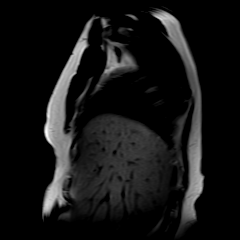

[Series 401: survey breathhold · axial · 15.0mm · 1.67mm/px · 1 of 11 slices shown]
[im 1/11]
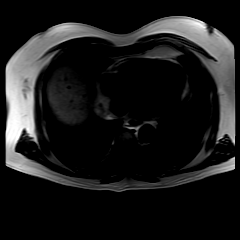

[Series 501: opt (id) · coronal · 5.0mm · 0.49mm/px · 3 of 33 slices shown]
[im 1/33]
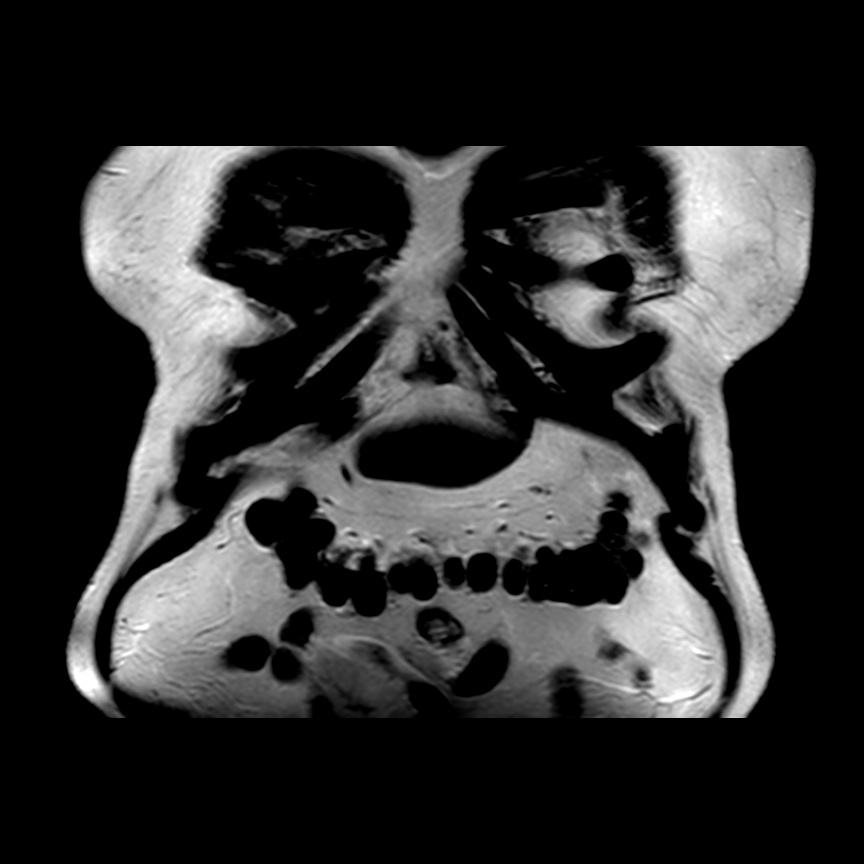
[im 17/33]
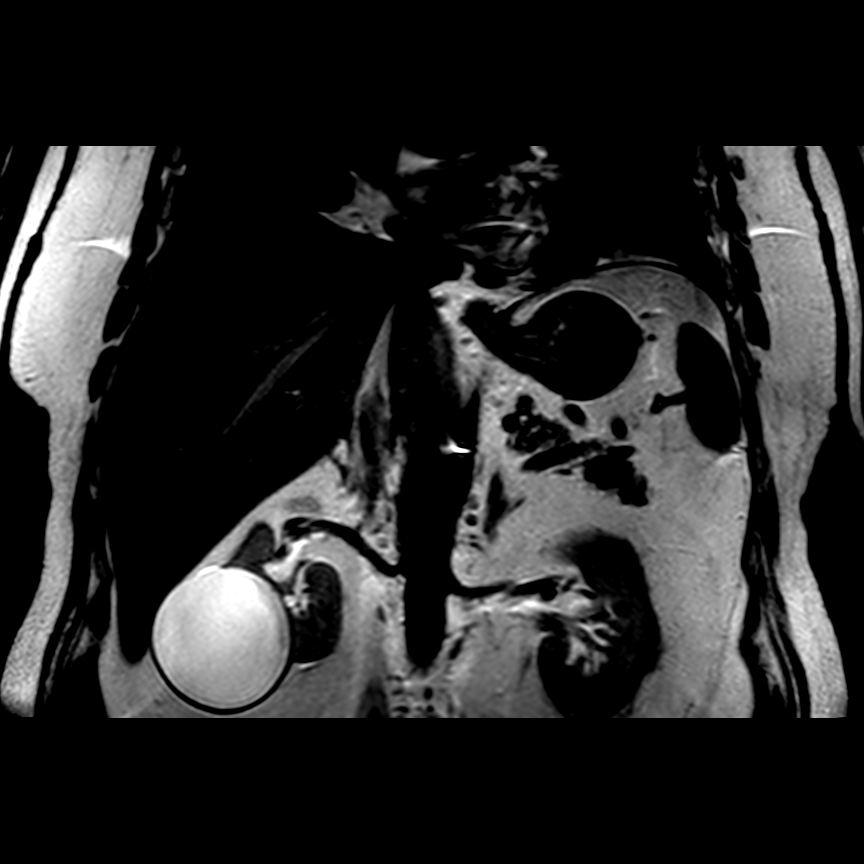
[im 33/33]
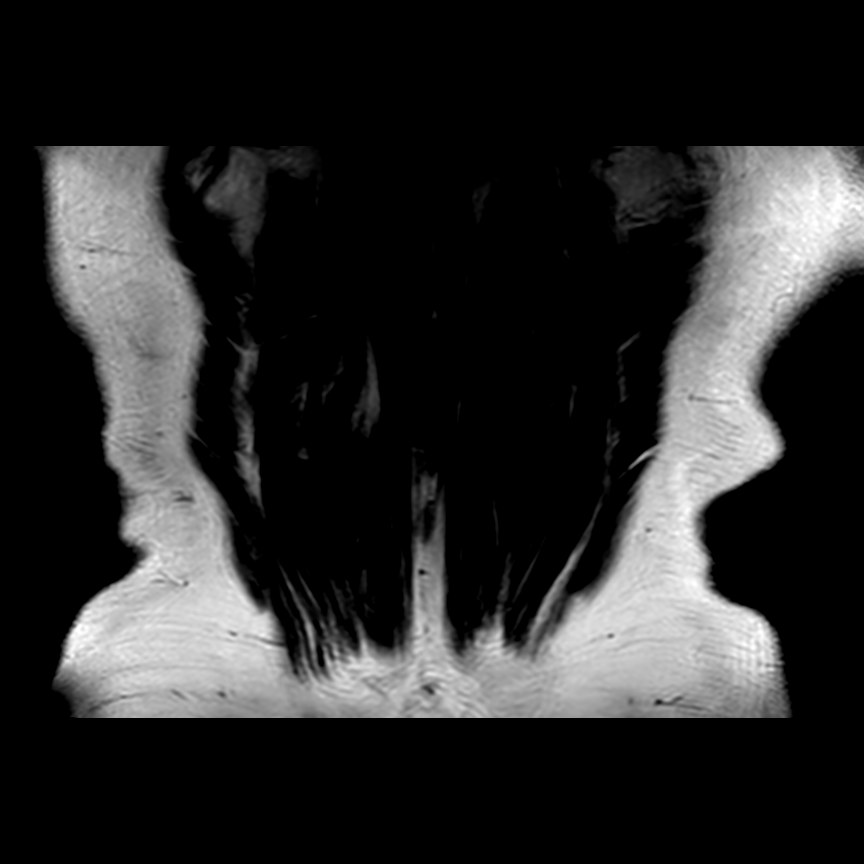

[Series 601: dual_ffe_op-ip_bh · axial · 5.5mm · 1.19mm/px · z∈[-203,+76]mm · 9 of 88 slices shown]
[im 1/88]
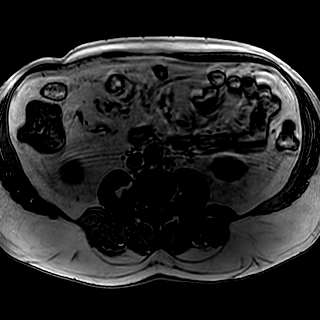
[im 11/88]
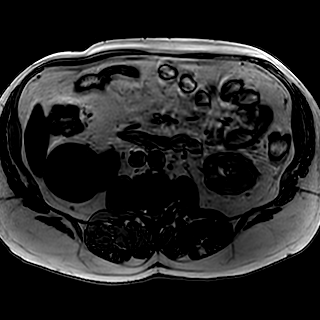
[im 22/88]
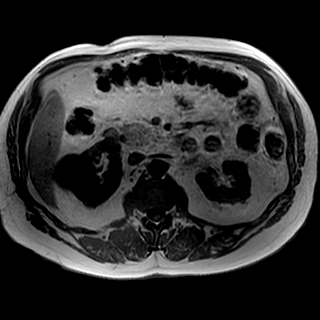
[im 33/88]
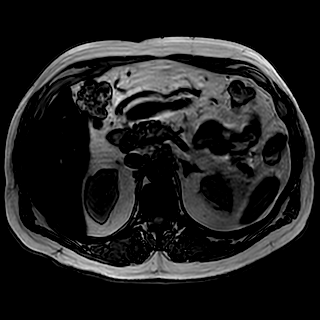
[im 44/88]
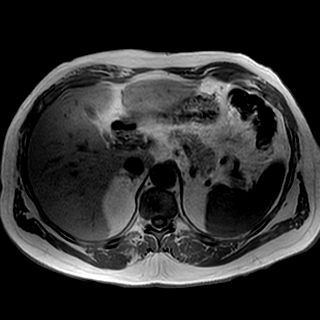
[im 55/88]
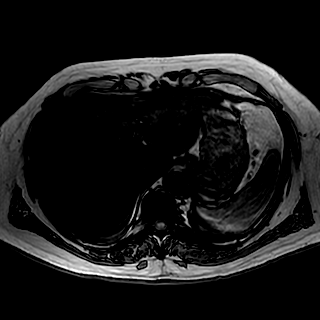
[im 66/88]
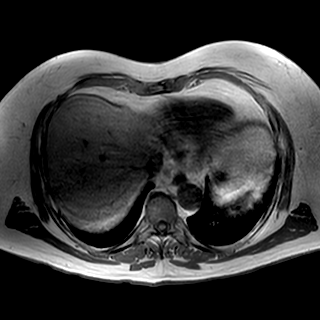
[im 77/88]
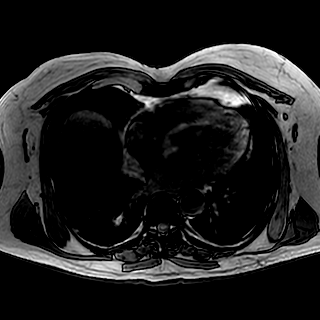
[im 88/88]
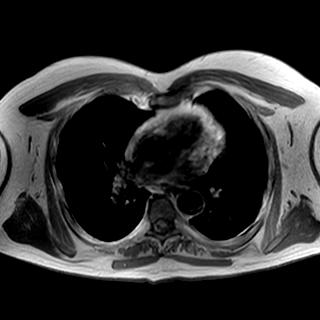

[Series 602: sout of phase · axial · 5.5mm · 1.19mm/px · z∈[-203,+76]mm · 4 of 44 slices shown]
[im 1/44]
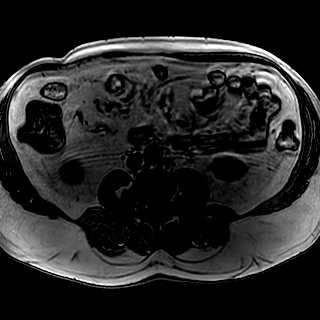
[im 15/44]
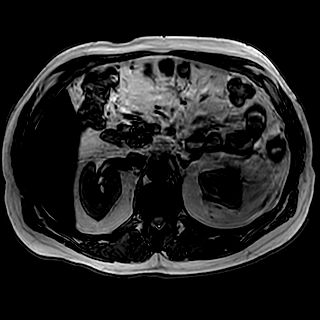
[im 29/44]
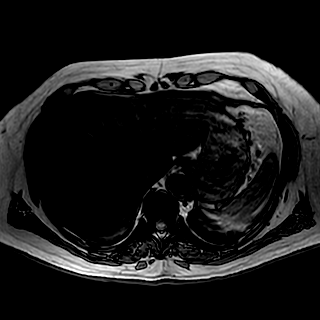
[im 44/44]
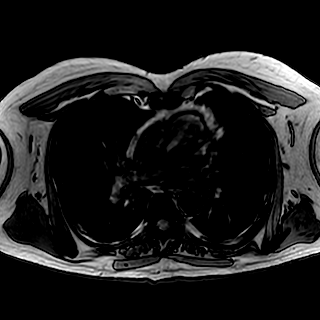

[Series 603: sin phase · axial · 5.5mm · 1.19mm/px · z∈[-203,+76]mm · 4 of 44 slices shown]
[im 1/44]
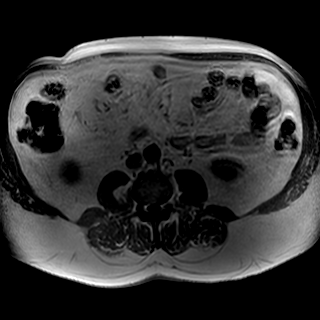
[im 15/44]
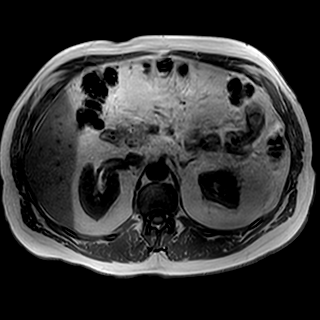
[im 29/44]
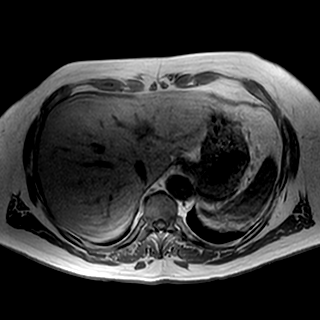
[im 44/44]
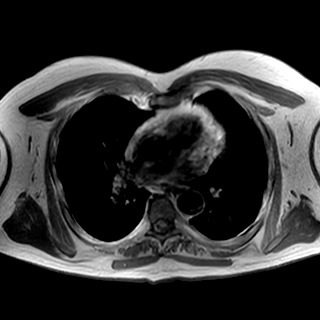

[Series 701: t2_ax_mvxd_hr_rt · axial · 5.0mm · 0.49mm/px · 1 of 48 slices shown]
[im 1/48]
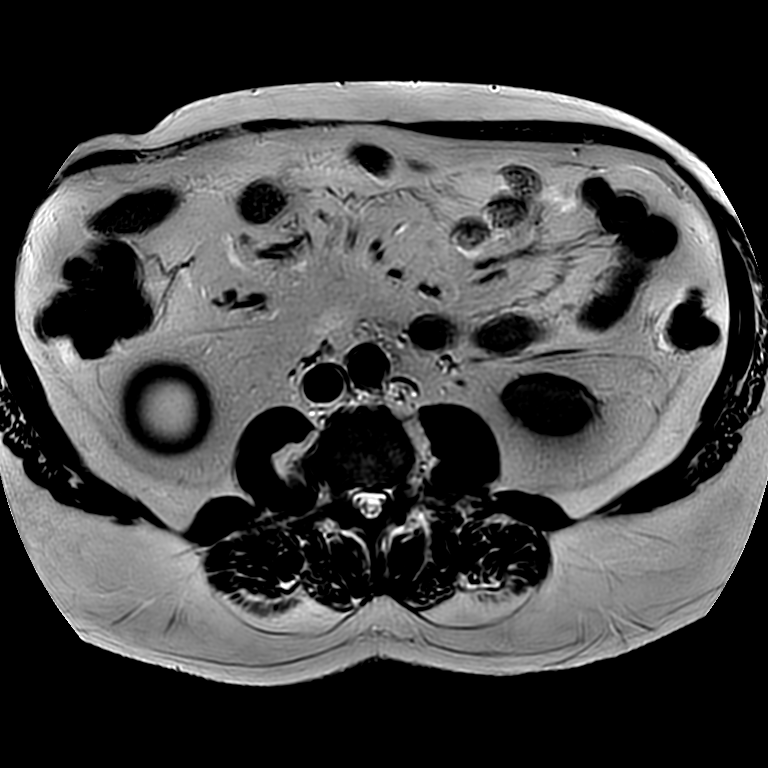

[24 of 48 positions shown; findings below may reference images not displayed]

FINDINGS: There is significant signal dropout on the out of phase imaging within 
the liver consistent with fatty infiltration liver. I do not see discrete mass. 
Gallbladder is normal in appearance. 
Large simple cyst seen right kidney as was described on sonography. Kidneys are 
otherwise unremarkable in appearance. 
Pancreas, spleen and adrenal glands are unremarkable appearance. Biliary tree is 
within normal limits.
IMPRESSION: Findings consistent with diffuse fatty infiltration liver. Simple cyst right 
kidney.
